# Patient Record
Sex: Male | Born: 1960 | Race: Black or African American | Hispanic: No | Marital: Married | Smoking: Never smoker
Health system: Southern US, Community
[De-identification: ages and names within clinical notes are randomized; demographics above are authoritative.]

## PROBLEM LIST (undated history)

## (undated) DIAGNOSIS — I10 Essential (primary) hypertension: Secondary | ICD-10-CM

## (undated) DIAGNOSIS — C801 Malignant (primary) neoplasm, unspecified: Secondary | ICD-10-CM

## (undated) DIAGNOSIS — E119 Type 2 diabetes mellitus without complications: Secondary | ICD-10-CM

## (undated) DIAGNOSIS — R972 Elevated prostate specific antigen [PSA]: Secondary | ICD-10-CM

## (undated) DIAGNOSIS — E7800 Pure hypercholesterolemia, unspecified: Secondary | ICD-10-CM

## (undated) HISTORY — PX: LEG SURGERY: SHX1003

## (undated) HISTORY — PX: PROSTATE BIOPSY: SHX241

---

## 1999-05-03 HISTORY — PX: LEG SURGERY: SHX1003

## 1999-05-19 ENCOUNTER — Encounter: Payer: Self-pay | Admitting: Emergency Medicine

## 1999-05-19 ENCOUNTER — Encounter: Payer: Self-pay | Admitting: Specialist

## 1999-05-19 ENCOUNTER — Inpatient Hospital Stay (HOSPITAL_COMMUNITY): Admission: AD | Admit: 1999-05-19 | Discharge: 1999-05-21 | Payer: Self-pay | Admitting: Specialist

## 2011-04-21 ENCOUNTER — Other Ambulatory Visit (HOSPITAL_COMMUNITY): Payer: Self-pay | Admitting: Internal Medicine

## 2011-04-21 ENCOUNTER — Other Ambulatory Visit (HOSPITAL_COMMUNITY): Payer: Self-pay | Admitting: Gastroenterology

## 2011-04-29 ENCOUNTER — Other Ambulatory Visit (HOSPITAL_COMMUNITY): Payer: Self-pay

## 2011-04-29 ENCOUNTER — Ambulatory Visit (HOSPITAL_COMMUNITY)
Admission: RE | Admit: 2011-04-29 | Discharge: 2011-04-29 | Disposition: A | Discharge status: Home / Self Care | Payer: 59 | Source: Ambulatory Visit | Attending: Gastroenterology | Admitting: Gastroenterology

## 2011-04-29 DIAGNOSIS — K5730 Diverticulosis of large intestine without perforation or abscess without bleeding: Secondary | ICD-10-CM | POA: Insufficient documentation

## 2013-10-16 ENCOUNTER — Encounter (HOSPITAL_COMMUNITY): Payer: Self-pay | Admitting: Emergency Medicine

## 2013-10-16 ENCOUNTER — Emergency Department (HOSPITAL_COMMUNITY)
Admission: EM | Admit: 2013-10-16 | Discharge: 2013-10-17 | Disposition: A | Discharge status: Home / Self Care | Payer: 59 | Attending: Emergency Medicine | Admitting: Emergency Medicine

## 2013-10-16 DIAGNOSIS — Y9389 Activity, other specified: Secondary | ICD-10-CM | POA: Insufficient documentation

## 2013-10-16 DIAGNOSIS — Z880 Allergy status to penicillin: Secondary | ICD-10-CM | POA: Insufficient documentation

## 2013-10-16 DIAGNOSIS — M549 Dorsalgia, unspecified: Secondary | ICD-10-CM

## 2013-10-16 DIAGNOSIS — S3981XA Other specified injuries of abdomen, initial encounter: Secondary | ICD-10-CM | POA: Insufficient documentation

## 2013-10-16 DIAGNOSIS — E119 Type 2 diabetes mellitus without complications: Secondary | ICD-10-CM | POA: Insufficient documentation

## 2013-10-16 DIAGNOSIS — IMO0002 Reserved for concepts with insufficient information to code with codable children: Secondary | ICD-10-CM | POA: Insufficient documentation

## 2013-10-16 DIAGNOSIS — Y92410 Unspecified street and highway as the place of occurrence of the external cause: Secondary | ICD-10-CM | POA: Insufficient documentation

## 2013-10-16 HISTORY — DX: Type 2 diabetes mellitus without complications: E11.9

## 2013-10-16 MED ORDER — TRAMADOL HCL 50 MG PO TABS: 50.0000 mg | ORAL_TABLET | Freq: Four times a day (QID) | ORAL | Status: DC | PRN
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
## 2013-10-16 NOTE — Discharge Instructions (Signed)
Take the prescribed medication as directed. May take additional tylenol if needed. You will continue to be sore for the next few days. Return to the ED for new or worsening symptoms.
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
## 2013-10-23 NOTE — ED Provider Notes (Signed)
Medical screening examination/treatment/procedure(s) were performed by non-physician practitioner and as supervising physician I was immediately available for consultation/collaboration.   EKG Interpretation None      Devoria AlbeIva Knapp, MD, Armando GangFACEP   Ward GivensIva L Knapp, MD 10/23/13 [phone]

## 2014-11-08 ENCOUNTER — Emergency Department (INDEPENDENT_AMBULATORY_CARE_PROVIDER_SITE_OTHER): Payer: Commercial Managed Care - HMO

## 2014-11-08 ENCOUNTER — Emergency Department (HOSPITAL_COMMUNITY)
Admission: EM | Admit: 2014-11-08 | Discharge: 2014-11-08 | Disposition: A | Discharge status: Home / Self Care | Payer: Commercial Managed Care - HMO | Source: Home / Self Care | Attending: Family Medicine | Admitting: Family Medicine

## 2014-11-08 ENCOUNTER — Encounter (HOSPITAL_COMMUNITY): Payer: Self-pay | Admitting: *Deleted

## 2014-11-08 DIAGNOSIS — M10032 Idiopathic gout, left wrist: Secondary | ICD-10-CM | POA: Diagnosis not present

## 2014-11-08 MED ORDER — NAPROXEN 500 MG PO TABS: 500.0000 mg | ORAL_TABLET | Freq: Two times a day (BID) | ORAL | Status: DC

## 2014-11-08 MED ORDER — COLCHICINE 0.6 MG PO TABS: 0.6000 mg | ORAL_TABLET | Freq: Two times a day (BID) | ORAL | Status: DC

## 2014-11-08 NOTE — ED Provider Notes (Signed)
CSN: [id number]     Arrival date & time 11/08/14  1403 History   First MD Initiated Contact with Patient 11/08/14 1447     Chief Complaint  Patient presents with  . Hand Problem   (Consider location/radiation/quality/duration/timing/severity/associated sxs/prior Treatment) Patient is a 54 y.o. male presenting with hand pain. The history is provided by the patient.  Hand Pain This is a new problem. The current episode started 6 to 12 hours ago. The problem has been gradually worsening. Associated symptoms comments: Awoke with pain and sts to left wrist this am, nki, no h/o gout , no other joints involved..    Past Medical History  Diagnosis Date  . Diabetes mellitus without complication    Past Surgical History  Procedure Laterality Date  . Leg surgery Left    History reviewed. No pertinent family history. History  Substance Use Topics  . Smoking status: Never Smoker   . Smokeless tobacco: Not on file  . Alcohol Use: No    Review of Systems  Constitutional: Negative.   Musculoskeletal: Positive for joint swelling.  Skin: Negative.     Allergies  Penicillins  Home Medications   Prior to Admission medications   Medication Sig Start Date End Date Taking? Authorizing Provider  colchicine 0.6 MG tablet Take 1 tablet (0.6 mg total) by mouth 2 (two) times daily. 11/08/14   Linna HoffJames D Etola Mull, MD  naproxen (NAPROSYN) 500 MG tablet Take 1 tablet (500 mg total) by mouth 2 (two) times daily. 11/08/14   Linna HoffJames D Tymeshia Awan, MD  traMADol (ULTRAM) 50 MG tablet Take 1 tablet (50 mg total) by mouth every 6 (six) hours as needed. 10/16/13   Garlon HatchetLisa M Sanders, PA-C   BP 137/82 mmHg  Pulse 65  Temp(Src) 98.5 F (36.9 C) (Oral)  Resp 16  SpO2 96% Physical Exam  Constitutional: He is oriented to person, place, and time. He appears well-developed and well-nourished. No distress.  Musculoskeletal: He exhibits tenderness.       Left wrist: He exhibits decreased range of motion, tenderness, bony tenderness  and swelling. He exhibits no deformity.  Neurological: He is alert and oriented to person, place, and time.  Skin: Skin is warm and dry. No rash noted.  Nursing note and vitals reviewed.   ED Course  Procedures (including critical care time) Labs Review Labs Reviewed - No data to display  Imaging Review Dg Wrist Complete Left  11/08/2014   CLINICAL DATA:  Wrist pain for 1 day  EXAM: LEFT WRIST - COMPLETE 3+ VIEW  COMPARISON:  None.  FINDINGS: There is no evidence of fracture or dislocation. There is no evidence of arthropathy or other focal bone abnormality. Soft tissues are unremarkable.  IMPRESSION: Negative.   Electronically Signed   By: Jolaine ClickArthur  Hoss M.D.   On: 11/08/2014 15:23   X-rays reviewed and report per radiologist.   MDM   1. Acute idiopathic gout of left wrist        Linna HoffJames D Cynara Tatham, MD 11/08/14 1535

## 2014-11-08 NOTE — Discharge Instructions (Signed)
Wear splint for comfort, take both medicines as prescribed, see hand specialist next week for recheck.

## 2014-11-08 NOTE — ED Notes (Signed)
Pt  Reports  Symptoms  Of     Swelling  Of the l  Hand   -    He  denys     Any  Injury        The  Hand  For the  Most part is  Not  painfull

## 2016-03-03 IMAGING — DX DG WRIST COMPLETE 3+V*L*
4 series · 4 of 4 positions shown · non-contrast
Comparison: None.

CLINICAL DATA: Wrist pain for 1 day

EXAM:
LEFT WRIST - COMPLETE 3+ VIEW

[wrist pa]
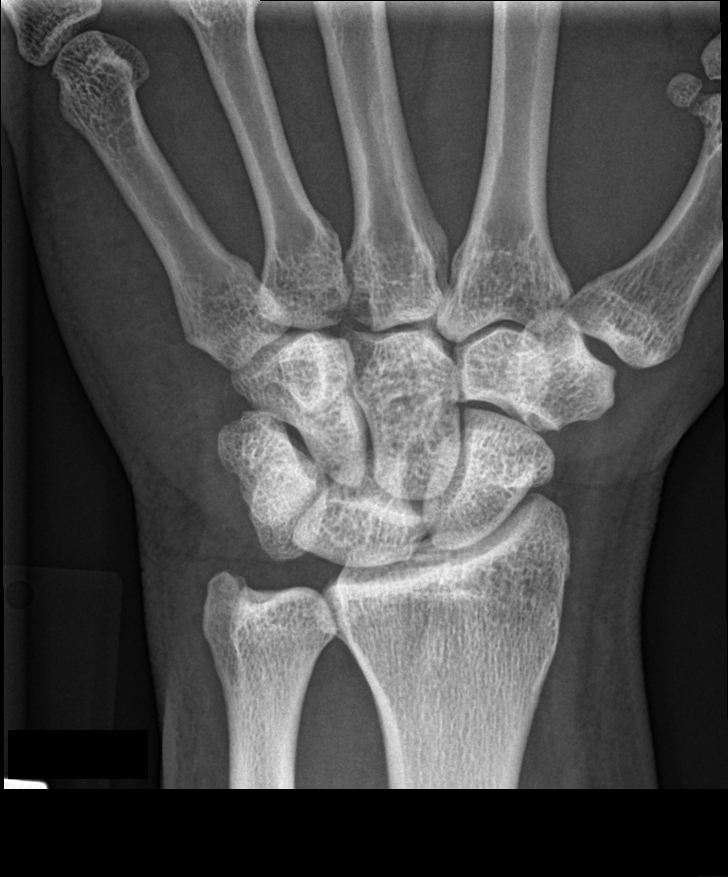

[wrist navicular]
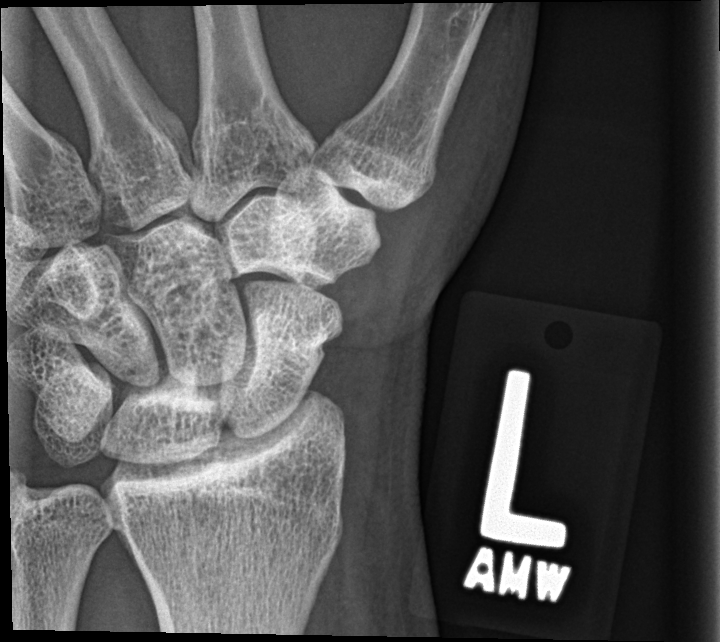

[wrist obl]
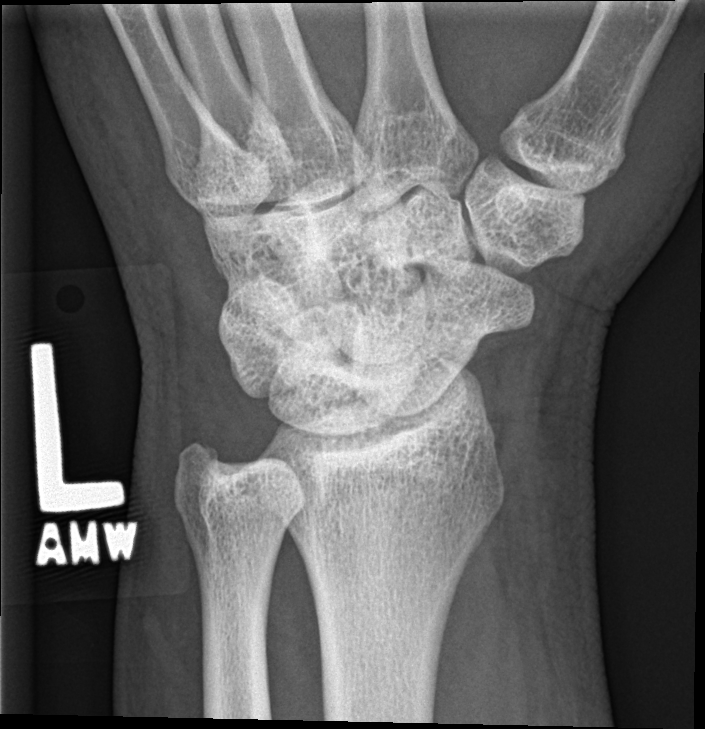

[wrist lat]
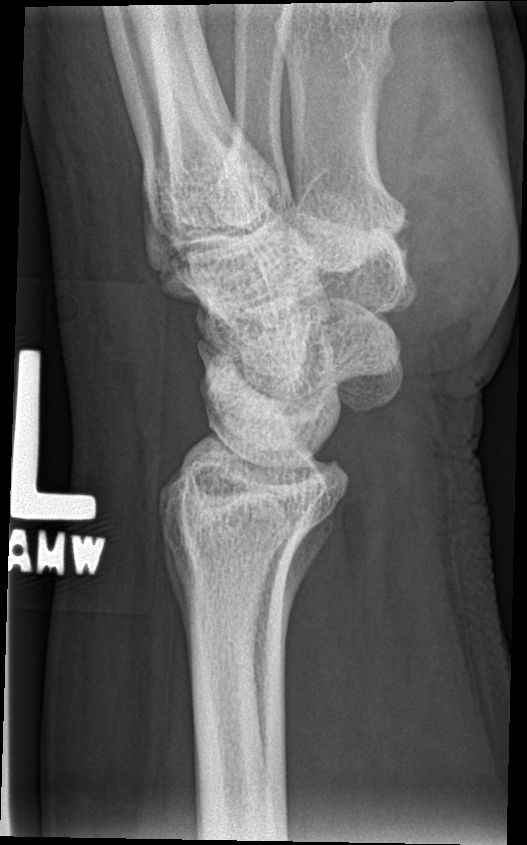

[4 of 4 positions shown; findings below may reference images not displayed]

FINDINGS: There is no evidence of fracture or dislocation. There is no
evidence of arthropathy or other focal bone abnormality. Soft
tissues are unremarkable.
IMPRESSION: Negative.

## 2016-10-04 DIAGNOSIS — I10 Essential (primary) hypertension: Secondary | ICD-10-CM | POA: Diagnosis not present

## 2016-10-04 DIAGNOSIS — E782 Mixed hyperlipidemia: Secondary | ICD-10-CM | POA: Diagnosis not present

## 2016-10-04 DIAGNOSIS — E119 Type 2 diabetes mellitus without complications: Secondary | ICD-10-CM | POA: Diagnosis not present

## 2017-04-13 DIAGNOSIS — Z0000 Encounter for general adult medical examination without abnormal findings: Secondary | ICD-10-CM | POA: Diagnosis not present

## 2017-04-13 DIAGNOSIS — E291 Testicular hypofunction: Secondary | ICD-10-CM | POA: Diagnosis not present

## 2017-04-13 DIAGNOSIS — E782 Mixed hyperlipidemia: Secondary | ICD-10-CM | POA: Diagnosis not present

## 2017-04-13 DIAGNOSIS — I10 Essential (primary) hypertension: Secondary | ICD-10-CM | POA: Diagnosis not present

## 2017-04-13 DIAGNOSIS — E119 Type 2 diabetes mellitus without complications: Secondary | ICD-10-CM | POA: Diagnosis not present

## 2017-04-20 DIAGNOSIS — E291 Testicular hypofunction: Secondary | ICD-10-CM | POA: Diagnosis not present

## 2017-10-12 DIAGNOSIS — I10 Essential (primary) hypertension: Secondary | ICD-10-CM | POA: Diagnosis not present

## 2017-10-12 DIAGNOSIS — E7800 Pure hypercholesterolemia, unspecified: Secondary | ICD-10-CM | POA: Diagnosis not present

## 2017-10-12 DIAGNOSIS — E291 Testicular hypofunction: Secondary | ICD-10-CM | POA: Diagnosis not present

## 2017-10-12 DIAGNOSIS — E1169 Type 2 diabetes mellitus with other specified complication: Secondary | ICD-10-CM | POA: Diagnosis not present

## 2017-12-26 DIAGNOSIS — H40003 Preglaucoma, unspecified, bilateral: Secondary | ICD-10-CM | POA: Diagnosis not present

## 2018-05-16 DIAGNOSIS — Z0000 Encounter for general adult medical examination without abnormal findings: Secondary | ICD-10-CM | POA: Diagnosis not present

## 2018-05-16 DIAGNOSIS — E7800 Pure hypercholesterolemia, unspecified: Secondary | ICD-10-CM | POA: Diagnosis not present

## 2018-05-16 DIAGNOSIS — E1169 Type 2 diabetes mellitus with other specified complication: Secondary | ICD-10-CM | POA: Diagnosis not present

## 2019-12-17 ENCOUNTER — Other Ambulatory Visit: Payer: Commercial Managed Care - HMO

## 2021-04-01 HISTORY — PX: COLONOSCOPY: SHX174

## 2023-05-24 ENCOUNTER — Other Ambulatory Visit: Payer: Self-pay | Admitting: Nurse Practitioner

## 2023-05-24 DIAGNOSIS — R972 Elevated prostate specific antigen [PSA]: Secondary | ICD-10-CM

## 2023-06-25 ENCOUNTER — Ambulatory Visit
Admission: RE | Admit: 2023-06-25 | Discharge: 2023-06-25 | Disposition: A | Discharge status: Home / Self Care | Payer: Medicare Other | Source: Ambulatory Visit | Attending: Nurse Practitioner | Admitting: Nurse Practitioner

## 2023-06-25 DIAGNOSIS — R972 Elevated prostate specific antigen [PSA]: Secondary | ICD-10-CM

## 2023-06-25 MED ORDER — GADOPICLENOL 0.5 MMOL/ML IV SOLN
10.0000 mL | Freq: Once | INTRAVENOUS | Status: AC | PRN
  Administered 2023-06-25: 10 mL via INTRAVENOUS

## 2023-07-24 DIAGNOSIS — E1169 Type 2 diabetes mellitus with other specified complication: Secondary | ICD-10-CM | POA: Diagnosis not present

## 2023-07-24 DIAGNOSIS — Z0000 Encounter for general adult medical examination without abnormal findings: Secondary | ICD-10-CM | POA: Diagnosis not present

## 2023-07-24 DIAGNOSIS — I10 Essential (primary) hypertension: Secondary | ICD-10-CM | POA: Diagnosis not present

## 2023-07-24 DIAGNOSIS — Z23 Encounter for immunization: Secondary | ICD-10-CM | POA: Diagnosis not present

## 2023-07-24 DIAGNOSIS — E7800 Pure hypercholesterolemia, unspecified: Secondary | ICD-10-CM | POA: Diagnosis not present

## 2023-07-24 DIAGNOSIS — Z5181 Encounter for therapeutic drug level monitoring: Secondary | ICD-10-CM | POA: Diagnosis not present

## 2024-01-12 ENCOUNTER — Telehealth: Payer: Self-pay

## 2024-01-12 NOTE — Telephone Encounter (Signed)
 I called pt to introduce myself as  the Coordinator of the [facility].   1. I confirmed with the patient he is aware of his referral to the clinic 9/23, arriving @ 12:30 pm.    2. I discussed the format of the clinic and the physicians he will be seeing that day.   3. I discussed where the clinic is located and how to contact me.   4. I confirmed his address and informed him I would be mailing a packet of information and forms to be completed. I asked him to bring them with him the day of his appointment.    He voiced understanding of the above. I asked him to call me if he has any questions or concerns regarding his appointments or the forms he needs to complete.

## 2024-01-18 NOTE — Progress Notes (Signed)
                               Care Plan Summary  Name: Mekai Wilkinson DOB: 08-23-1960   Your Medical Team:   Urologist -  Dr. Gretel Ferrara, [facility]  Radiation Oncologist - Dr. Donnice Barge, [facility]   Medical Oncologist - Dr. Pauletta Chihuahua, [facility]  Recommendations: Surgery Radiation  * These recommendations are based on information available as of today's consult.      Recommendations may change depending on the results of further tests or exams.    Next Steps: 1) Consider all your options.  Contact Vertell, your nurse navigator, with any questions or treatment decision.  When appointments need to be scheduled, you will be contacted by [facility] and/or [facility].  Questions?  Please do not hesitate to call Vertell Pont, BSN, RN at [phone] with any questions or concerns.  Vertell is your Oncology Nurse Navigator and is available to assist you while you're receiving your medical care at [facility].

## 2024-01-19 ENCOUNTER — Telehealth: Payer: Self-pay | Admitting: Radiation Oncology

## 2024-01-19 NOTE — Telephone Encounter (Signed)
 9/19 patient left voicemail concerning his E check in through mychart for his appt on 9/23.  Forward email to Vertell and copied Sari, so they are aware

## 2024-01-22 DIAGNOSIS — C61 Malignant neoplasm of prostate: Secondary | ICD-10-CM | POA: Insufficient documentation

## 2024-01-22 NOTE — Progress Notes (Unsigned)
 [facility] CONSULT NOTE  Patient Care Team: Rexanne Ingle, MD as PCP - General (Internal Medicine) Vertell Pont, RN as Oncology Nurse Navigator  ASSESSMENT & PLAN:  Chris Espinoza is a 63 y.o.male with history of DM2, hypertension, hyperlipidemia being seen at [facility] for prostate cancer.  Initial diagnosis: [id number] GS 4+3=7 GG3 in 1 core. 3+3 in another core. PSA 5.17 localized unfavorable intermediate risk PC Treatment: To be determined  His case was discussed at tumor board with multiple specialists including radiation oncologist, urology oncologist, pathologist, radiologist. The patient was counseled on the natural history of prostate cancer and the standard treatment options that are available for prostate cancer.   For unfavorable intermediate risk, per NCCN guideline, in patients with > 10 years of life expectancy, RP + PLND or RT + ADT are both options resulted in excellent long term survival.  Treatment is recommended. Patient was encouraged to consider the side effect profiles of each treatment modality and make an informed decision for definitive treatment.  Patient will follow-up with radiation oncology or urologic oncology for definitive treatment.  He may follow-up with medical oncology as needed.  Supportive calcium (1000-1200 mg daily from food and supplements) and vitamin D3 (1000 IU daily) Healthy lifestyle to prevent diabetes and CV disease Weight-bearing exercises (30 minutes per day) Limit alcohol consumption and avoid smoking  All questions were answered. The patient knows to call the clinic with any problems, questions or concerns. No barriers to learning was detected.  Chris JAYSON Chihuahua, MD 9/23/20255:02 PM  CHIEF COMPLAINTS/PURPOSE OF CONSULTATION:  prostate cancer  HISTORY OF PRESENTING ILLNESS:  Chris Espinoza 63 y.o. male is here because of prostate cancer.  I have reviewed his chart and materials related to his cancer extensively and  collaborated history with the patient. Summary of oncologic history is as follows:  Chris Espinoza was referred to urology for elevated PSA.  Report PSA was 3.52 in February 2022, 3.36 in February 2023, 6.43 in March 2024.  Follow-up in July 2024 showed PSA dropped to 3.77.  In 05/2023 PSA rise to 4.6.  He has no worsening LUTS.  Follow-up in 08/2023 PSA rise to 5.17.  MRI from 06/2023 showed no focal prostatic lesion. PI-RADS category 2. He has BPH.  He had a follow-up biopsy on 12/06/2023.  Pathology showed rostate adenocarcinoma GS 4+3=7 in 1 core. 3+3 in another core.   He denies any significant urinary symptoms such as dysuria, increased frequency, hesitancy, hematuria or difficulty urinating.  Oncology History  Primary prostate adenocarcinoma (HCC)  12/06/2023 Pathology Results   Prostate biopsy Prostate adenocarcinoma GS 4+3=7 in 1 core.  3+3 in another core.   01/22/2024 Initial Diagnosis   Primary prostate adenocarcinoma [facility])     MEDICAL HISTORY:  Past Medical History:  Diagnosis Date   Diabetes mellitus without complication (HCC)    Elevated PSA    Hypercholesterolemia    Hypertension     SURGICAL HISTORY: Past Surgical History:  Procedure Laterality Date   COLONOSCOPY  04/2021   LEG SURGERY Left 05/1999   PROSTATE BIOPSY      SOCIAL HISTORY: Social History   Socioeconomic History   Marital status: Married    Spouse name: Not on file   Number of children: Not on file   Years of education: Not on file   Highest education level: Not on file  Occupational History   Not on file  Tobacco Use   Smoking status: Never   Smokeless tobacco: Never  Vaping Use  Vaping status: Never Used  Substance and Sexual Activity   Alcohol use: No   Drug use: No   Sexual activity: Not on file  Other Topics Concern   Not on file  Social History Narrative   Not on file   Social Drivers of Health   Financial Resource Strain: Not on file  Food Insecurity: No Food Insecurity  (01/23/2024)   Hunger Vital Sign    Worried About Running Out of Food in the Last Year: Never true    Ran Out of Food in the Last Year: Never true  Transportation Needs: No Transportation Needs (01/23/2024)   PRAPARE - Administrator, Civil Service (Medical): No    Lack of Transportation (Non-Medical): No  Physical Activity: Not on file  Stress: Not on file  Social Connections: Not on file  Intimate Partner Violence: Not At Risk (01/23/2024)   Humiliation, Afraid, Rape, and Kick questionnaire    Fear of Current or Ex-Partner: No    Emotionally Abused: No    Physically Abused: No    Sexually Abused: No    FAMILY HISTORY: Family History  Problem Relation Age of Onset   Prostate cancer Brother     ALLERGIES:  is allergic to penicillins.  MEDICATIONS:  Current Outpatient Medications  Medication Sig Dispense Refill   atorvastatin (LIPITOR) 10 MG tablet Take 10 mg by mouth daily.     Lancets (ONETOUCH DELICA PLUS LANCET33G) MISC USE TO CHECK YOUR BLOOD SUGAR  ONCE DAILY AS DIRECETED; Duration: 100     metFORMIN (GLUCOPHAGE) 500 MG tablet Take 500 mg by mouth 2 (two) times daily.     ONETOUCH VERIO test strip 1 each daily.     OZEMPIC, 2 MG/DOSE, 8 MG/3ML SOPN Inject 2 mg into the skin once a week.     ramipril (ALTACE) 2.5 MG capsule Take 2.5 mg by mouth daily.     No current facility-administered medications for this visit.    REVIEW OF SYSTEMS:   All relevant systems were reviewed with the patient and are negative.  PHYSICAL EXAMINATION: ECOG PERFORMANCE STATUS: 0 - Asymptomatic  GENERAL: alert, no distress and comfortable   LABORATORY & PATHOLOGY DATA:  I have reviewed the results of labs, PSA and biopsy results related to his cancer.  RADIOGRAPHIC STUDIES: I have reviewed the radiological images related to his cancer.

## 2024-01-23 ENCOUNTER — Encounter: Payer: Self-pay | Admitting: Radiation Oncology

## 2024-01-23 ENCOUNTER — Inpatient Hospital Stay

## 2024-01-23 ENCOUNTER — Ambulatory Visit
Admission: RE | Admit: 2024-01-23 | Discharge: 2024-01-23 | Disposition: A | Discharge status: Home / Self Care | Source: Ambulatory Visit | Attending: Radiation Oncology | Admitting: Radiation Oncology

## 2024-01-23 VITALS — BP 146/78 | HR 58 | Temp 97.1°F | Resp 18 | Ht 68.0 in | Wt 204.2 lb

## 2024-01-23 DIAGNOSIS — C61 Malignant neoplasm of prostate: Secondary | ICD-10-CM | POA: Insufficient documentation

## 2024-01-23 DIAGNOSIS — Z8042 Family history of malignant neoplasm of prostate: Secondary | ICD-10-CM | POA: Insufficient documentation

## 2024-01-23 DIAGNOSIS — Z191 Hormone sensitive malignancy status: Secondary | ICD-10-CM | POA: Diagnosis not present

## 2024-01-23 HISTORY — DX: Essential (primary) hypertension: I10

## 2024-01-23 HISTORY — DX: Elevated prostate specific antigen (PSA): R97.20

## 2024-01-23 HISTORY — DX: Pure hypercholesterolemia, unspecified: E78.00

## 2024-01-23 NOTE — Progress Notes (Incomplete)
 SABRA

## 2024-01-23 NOTE — Progress Notes (Signed)
 Radiation Oncology         [phone] ________________________________  Multidisciplinary Prostate Cancer Clinic  Initial Radiation Oncology Consultation  Name: Chris Espinoza MRN: [id number]  Date: 01/23/2024  DOB: June 06, 1960  RR:Enopuz, Tanda, MD  Watt Rush, MD   REFERRING PHYSICIAN: Watt Rush, MD  DIAGNOSIS: 63 y.o. gentleman with stage T1c adenocarcinoma of the prostate with a Gleason's score of 4+3 and a PSA of 5.17    ICD-10-CM   1. Malignant neoplasm of prostate (HCC)  C61       HISTORY OF PRESENT ILLNESS::Chris Espinoza is a 63 y.o. gentleman. He was initially referred for evaluation in urology by Dr. Watt on 07/25/22 for an elevated PSA of 6.43. A repeat PSA in 08/2022, after a week of abstinence, had decreased to 4.22 and decreased further, to 3.77 in 10/2022 so they elected to continue to monitor closely. His PSA increased slightly to 4.6 in 05/2023. This prompted further evaluation with a a prostate MRI which was performed on 06/25/23 and was negative for any high grade, suspicious lesions. His PSA continued to increase, reaching 5.17 in 08/2023. Digital rectal examinations have shown stable bilateral lobe firmness without discrete nodularity. The patient proceeded to transrectal ultrasound with 12 biopsies of the prostate on 12/06/23.  The prostate volume measured 51 cc.  Out of 12 core biopsies,2 were positive.  The maximum Gleason score was 4+3, and this was seen in the left base lateral. Additionally, Gleason 3+3 was seen in the left apex.    The patient reviewed the biopsy results with his urologist and he has kindly been referred today to the multidisciplinary prostate cancer clinic for presentation of pathology and radiology studies in our conference for discussion of potential radiation treatment options and clinical evaluation.   PREVIOUS RADIATION THERAPY: No  PAST MEDICAL HISTORY:  has a past medical history of Diabetes mellitus without complication (HCC).     PAST SURGICAL HISTORY: Past Surgical History:  Procedure Laterality Date   LEG SURGERY Left     FAMILY HISTORY: family history is not on file.  SOCIAL HISTORY:  reports that he has never smoked. He does not have any smokeless tobacco history on file. He reports that he does not drink alcohol and does not use drugs.  ALLERGIES: Penicillins  MEDICATIONS:  Current Outpatient Medications  Medication Sig Dispense Refill   colchicine  0.6 MG tablet Take 1 tablet (0.6 mg total) by mouth 2 (two) times daily. 30 tablet 1   naproxen  (NAPROSYN ) 500 MG tablet Take 1 tablet (500 mg total) by mouth 2 (two) times daily. 30 tablet 1   traMADol  (ULTRAM ) 50 MG tablet Take 1 tablet (50 mg total) by mouth every 6 (six) hours as needed. 20 tablet 0   No current facility-administered medications for this encounter.    REVIEW OF SYSTEMS:  On review of systems, the patient reports that he is doing well overall. He denies any chest pain, shortness of breath, cough, fevers, chills, night sweats, unintended weight changes. He denies any bowel disturbances, and denies abdominal pain, nausea or vomiting. He denies any new musculoskeletal or joint aches or pains. His IPSS was 2, indicating minimal urinary symptoms. His SHIM was 10, indicating he has moderate-severe erectile dysfunction. A complete review of systems is obtained and is otherwise negative.   PHYSICAL EXAM:  Wt Readings from Last 3 Encounters:  01/23/24 204 lb 3.2 oz (92.6 kg)  10/16/13 235 lb (106.6 kg)   Temp Readings from Last 3 Encounters:  01/23/24 ROLLEN)  97.1 F (36.2 C)  11/08/14 98.5 F (36.9 C) (Oral)  10/16/13 98.4 F (36.9 C) (Oral)   BP Readings from Last 3 Encounters:  01/23/24 (!) 146/78  11/08/14 137/82  10/16/13 134/67   Pulse Readings from Last 3 Encounters:  01/23/24 (!) 58  11/08/14 65  10/16/13 60   Pain Assessment Pain Score: 0-No pain/10  In general this is a well appearing African American man in no acute  distress. He's alert and oriented x4 and appropriate throughout the examination. Cardiopulmonary assessment is negative for acute distress and he exhibits normal effort.    KPS = 100  100 - Normal; no complaints; no evidence of disease. 90   - Able to carry on normal activity; minor signs or symptoms of disease. 80   - Normal activity with effort; some signs or symptoms of disease. 61   - Cares for self; unable to carry on normal activity or to do active work. 60   - Requires occasional assistance, but is able to care for most of his personal needs. 50   - Requires considerable assistance and frequent medical care. 40   - Disabled; requires special care and assistance. 30   - Severely disabled; hospital admission is indicated although death not imminent. 20   - Very sick; hospital admission necessary; active supportive treatment necessary. 10   - Moribund; fatal processes progressing rapidly. 0     - Dead  Karnofsky DA, Abelmann WH, Craver LS and Burchenal JH [phone]) The use of the nitrogen mustards in the palliative treatment of carcinoma: with particular reference to bronchogenic carcinoma Cancer 1 634-56   LABORATORY DATA:  No results found for: WBC, HGB, HCT, MCV, PLT No results found for: NA, K, CL, CO2 No results found for: ALT, AST, GGT, ALKPHOS, BILITOT   RADIOGRAPHY: No results found.    IMPRESSION/PLAN: 63 y.o. gentleman with Stage T1c adenocarcinoma of the prostate with a Gleason score of 4+3 and a PSA of 5.17.    We discussed the patient's workup and outlined the nature of prostate cancer in this setting. The patient's T stage, Gleason's score, and PSA put him into the unfavorable intermediate risk group. Accordingly, he is eligible for a variety of potential treatment options including brachytherapy, 5.5 weeks of external radiation, or prostatectomy. We discussed the available radiation techniques, and focused on the details and logistics of  delivery. We discussed and outlined the risks, benefits, short and long-term effects associated with radiotherapy and compared and contrasted these with prostatectomy. We discussed the role of SpaceOAR gel in reducing the rectal toxicity associated with radiotherapy. He appears to have a good understanding of his disease and our treatment recommendations which are of curative intent.  He was encouraged to ask questions that were answered to his stated satisfaction.  At the end of the conversation the patient is undecided regarding his treatment preference and would like to take some additional time to consider his options.  He has our contact information and will let us  know if he ultimately elects to proceed with one of the radiation options and we will move forward with treatment planning accordingly at that time.  We enjoyed meeting him and his wife today and look forward to continuing to participate in his care.   We personally spent 60 minutes in this encounter including chart review, reviewing radiological studies, meeting face-to-face with the patient, entering orders and completing documentation.    Sabra MICAEL Rusk, PA-C    Donnice Barge, MD  [facility]  Radiation Oncology Direct Dial: [phone]  Fax: [phone] [email or web address]  Skype  LinkedIn   This document serves as a record of services personally performed by Donnice Barge, MD and Sabra Rusk, PA-C. It was created on their behalf by Izetta Neither, a trained medical scribe. The creation of this record is based on the scribe's personal observations and the provider's statements to them. This document has been checked and approved by the attending provider.

## 2024-01-23 NOTE — Consult Note (Signed)
 Multi-Disciplinary Clinic     01/23/2024   --------------------------------------------------------------------------------   Chris Espinoza  MRN: [id number]  DOB: 09-03-1960, 63 year old Male  SSN:    PRIMARY CARE:  Tanda Bame  PRIMARY CARE FAX:  [phone]  REFERRING:  Norleen Seltzer, MD  PROVIDER:  Norleen Seltzer, M.D.  TREATING:  Gretel Ferrara, M.D.  LOCATION:  [facility] [phone]     --------------------------------------------------------------------------------   CC/HPI: CC: Prostate Cancer   Physician requesting consult: Dr. Norleen Seltzer  PCP: Dr. Tanda Bame  Location of consult: [facility] - Prostate Cancer Multidisciplinary Clinic   Chris Espinoza is a 63 year old gentleman who is seen for an elevated PSA of 4.6 that prompted an MRI of the prostate on 06/25/2023. This was unremarkable. However, his PSA further increased to 5.17 prompting a TRUS biopsy of the prostate on 12/06/2023 by Dr. 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   Family history: Brother   Imaging studies: MRI (06/25/2023) - No EPE, SVI, LAD, or bone lesions.   PMH: He has a history of diabetes, hyperlipidemia, and hypertension.  PSH: No abdominal surgeries.   TNM stage: cT1c N0 Mx  PSA: 5.17  Gleason score: 4+3 = 7 (grade group 3)  Biopsy (12/06/2023): 2/12 cores positive  Left: Left apex (60%, 3+3 = 6), left lateral base (20%, 4+3 = 7)  Right: Benign  Prostate volume: 51.0 cc   Nomogram  OC disease: 58%  EPE: 39%  SVI: 5%  LNI: 7%  PFS (5 year, 10 year): 70%, 55%   Urinary function: IPSS is 2.  Erectile function: SHIM score is 10.     ALLERGIES: Penicillin    MEDICATIONS: Atorvastatin Calcium 10 MG Tablet  levoFLOXacin 750 MG Tablet 1 tablet po 1 hour prior to procedure.  metFORMIN HCl ER (MOD) 500 MG Tablet Extended Release 24 Hour  Ozempic  Ramipril 2.5 MG Capsule     GU PSH:  Prostate Needle Biopsy - 12/06/2023       PSH Notes: Broken leg   NON-GU PSH: Surgical Pathology, Gross And Microscopic Examination For Prostate Needle - 12/06/2023 Visit Complexity (formerly GPC1X) - 12/15/2023, 09/18/2023, 10/31/2022     GU PMH: ED due to arterial insufficiency - 12/15/2023 Elevated PSA - 12/15/2023, - 12/06/2023, His PSA is back up but below the prior high. His exam is benign and the MRI was negative. I discussed proceeding with a biopsy or getting an ExoDx test. I will send the ExoDx test and if that is below 15.6 I will have him return in 4mo with a PSA. If the ExoDx is >15.6, I will set up a biopsy. , - 09/18/2023, - 05/15/2023, His PSA has returned to normal. I will have him return in 6 months for a repeat and if it remains normal, he can resume PSA f/u with Dr. Polite. , - 10/31/2022, His PSA is up to 6.43 from 3.36 last year. He has no prostate nodularity or concerning symptoms. I will repeat the PSA with a week of abstinence and if still up, I will get an MRIP and consider a biopsy. If down, I will get a repeat in 3 months. 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 I reviewed AS, RALP, Seeds, EXRT, Cryo and HIFU with him. I feel he needs to be considered for  active therapy and he seems most interested in a seed implant but I will get him set up with the [facility] to make sure he has fully examined his options. - 12/15/2023 BPH w/o LUTS - 09/18/2023, Bilateral lobes are somewhat firm on palpation but no other palpable abnormality appreciated. He has some occasional morning time hesitancy but otherwise denies any bothersome lower urinary tract symptoms., - 05/15/2023, His prostate is mildly large and uniformly firm without nodules. , - 07/25/2022 Encounter for Prostate Cancer screening - 05/15/2023    NON-GU PMH: Diabetes Type 2    FAMILY HISTORY: Death In The Family Father - Other Prostate  Cancer - Brother   SOCIAL HISTORY: Marital Status: Married Has never drank.  Patient's occupation is/was Retired from the city[place]    REVIEW OF SYSTEMS:    GU Review Male:   Patient denies frequent urination, hard to postpone urination, burning/ pain with urination, get up at night to urinate, leakage of urine, stream starts and stops, trouble starting your streams, and have to strain to urinate .  Gastrointestinal (Lower):   Patient denies diarrhea and constipation.  Gastrointestinal (Upper):   Patient denies nausea and vomiting.  Constitutional:   Patient denies fever, night sweats, weight loss, and fatigue.  Skin:   Patient denies skin rash/ lesion and itching.  Eyes:   Patient denies blurred vision and double vision.  Ears/ Nose/ Throat:   Patient denies sore throat and sinus problems.  Hematologic/Lymphatic:   Patient denies swollen glands and easy bruising.  Cardiovascular:   Patient denies leg swelling and chest pains.  Respiratory:   Patient denies cough and shortness of breath.  Endocrine:   Patient denies excessive thirst.  Musculoskeletal:   Patient denies joint pain and back pain.  Neurological:   Patient denies headaches and dizziness.  Psychologic:   Patient denies depression and anxiety.   VITAL SIGNS: None   GU PHYSICAL EXAMINATION:    Prostate: Prostate about 50 grams. Left lobe normal consistency, right lobe normal consistency. Symmetrical lobes. No prostate nodule. Left lobe no tenderness, right lobe no tenderness.    MULTI-SYSTEM PHYSICAL EXAMINATION:    Constitutional: Well-nourished. No physical deformities. Normally developed. Good grooming.  Respiratory: No labored breathing, no use of accessory muscles. Clear bilaterally.  Cardiovascular: Normal temperature, normal extremity pulses, no swelling, no varicosities. Regular rate and rhythm.  Gastrointestinal: Obese abdomen.     Complexity of Data:  Lab Test Review:   PSA  Records Review:   Pathology Reports,  Previous Patient Records  X-Ray Review: MRI Prostate GSORAD: Reviewed Films.     09/11/23 05/08/23 10/24/22 08/02/22 06/24/22 06/26/21 06/26/20  PSA  Total PSA 5.17 ng/mL 4.60 ng/mL 3.77 ng/mL 4.22 ng/mL 6.43 ng/ml 3.36 ng/ml 3.5 ng/ml  Free PSA 0.89 ng/mL 0.91 ng/mL  0.64 ng/mL     % Free PSA 17 % PSA 20 % PSA  15 % PSA       PROCEDURES: None   ASSESSMENT:      ICD-10 Details  1 GU:   Prostate Cancer - C61    PLAN:           Document Letter(s):  Created for Patient: Clinical Summary         Notes:   1. Unfavorable intermediate risk prostate cancer: I had a detailed discussion with Chris Espinoza and his wife regarding his prostate cancer diagnosis. He has met with Dr. Patrcia earlier this afternoon. The patient was counseled about the natural history of prostate cancer and  the standard treatment options that are available for prostate cancer. It was explained to him how his age and life expectancy, clinical stage, Gleason score/prognostic grade group, and PSA (and PSA density) affect his prognosis, the decision to proceed with additional staging studies, as well as how that information influences recommended treatment strategies. We discussed the roles for active surveillance, radiation therapy, surgical therapy, androgen deprivation, as well as ablative therapy and other investigational options for the treatment of prostate cancer as appropriate to his individual cancer situation. We discussed the risks and benefits of these options with regard to their impact on cancer control and also in terms of potential adverse events, complications, and impact on quality of life particularly related to urinary and sexual function. The patient was encouraged to ask questions throughout the discussion today and all questions were answered to his stated satisfaction. In addition, the patient was provided with and/or directed to appropriate resources and literature for further education about prostate cancer  and treatment options. We discussed surgical therapy for prostate cancer including the different available surgical approaches. We discussed, in detail, the risks and expectations of surgery with regard to cancer control, urinary control, and erectile function as well as the expected postoperative recovery process. Additional risks of surgery including but not limited to bleeding, infection, hernia formation, nerve damage, lymphocele formation, bowel/rectal injury potentially necessitating colostomy, damage to the urinary tract resulting in urine leakage, urethral stricture, and the cardiopulmonary risks such as myocardial infarction, stroke, death, venothromboembolism, etc. were explained. The risk of open surgical conversion for robotic/laparoscopic prostatectomy was also discussed.   At this time, he remains undecided about his choice for treatment. If he did elect to proceed with surgical therapy, my tentative plan would be to perform a bilateral nerve-sparing robot-assisted laparoscopic radical prostatectomy and bilateral pelvic lymphadenectomy.   CC: Dr. Norleen Seltzer  Dr. Tanda Bame  Dr. Donnice Barge  Dr. Pauletta Chihuahua        Next Appointment:      Next Appointment: 03/13/2024 10:30 AM    Appointment Type: Laboratory Appointment    Location: [facility] [phone]    Provider: Lab LAB    Reason for Visit: 6 mo PSA with Reflex      E & M CODES: We spent 48 minutes dedicated to evaluation and management time, including face to face interaction, discussions on coordination of care, documentation, result review, and discussion with others as applicable.

## 2024-01-24 DIAGNOSIS — E1169 Type 2 diabetes mellitus with other specified complication: Secondary | ICD-10-CM | POA: Diagnosis not present

## 2024-01-24 DIAGNOSIS — I10 Essential (primary) hypertension: Secondary | ICD-10-CM | POA: Diagnosis not present

## 2024-01-24 DIAGNOSIS — E7800 Pure hypercholesterolemia, unspecified: Secondary | ICD-10-CM | POA: Diagnosis not present

## 2024-01-30 NOTE — Progress Notes (Signed)
 RN left message for call back after recent visit from [facility] on 01/23/24.

## 2024-02-01 NOTE — Progress Notes (Signed)
 RN spoke with patient to follow up from recent [facility].  Patient remains undecided at this time and would like to take additional time to consider his options.  Patient is leaning towards surgery and will review educational videos in the next few days.

## 2024-02-02 NOTE — Addendum Note (Signed)
 Encounter addended by: Patrcia Cough, MD on: 02/02/2024 12:36 PM  Actions taken: Problem List reviewed, Medication List reviewed, Allergies reviewed

## 2024-02-13 NOTE — Progress Notes (Signed)
 RN left message for call back to follow up to review treatment decision or any additional questions.

## 2024-02-22 NOTE — Progress Notes (Signed)
 RN left message for call back to review treatment decision or any follow up questions.

## 2024-02-29 NOTE — Progress Notes (Signed)
 RN spoke with patient and he has confirmed he would like to proceed with robotic prostatectomy for his stage T1c adenocarcinoma of the prostate with a Gleason's score of 4+3 and a PSA of 5.17.   MD notified.  Plan of care in progress.

## 2024-04-05 NOTE — Progress Notes (Signed)
 RN spoke with patient, surgery date still pending at this time.  RN left message with Zona at [facility] to follow up.  Will continue to follow.

## 2024-04-09 NOTE — Progress Notes (Signed)
 Surgery date pending per Zona due to waiting for 2026 schedule to open.    RN updated patient, no additional needs at this time.

## 2024-04-10 ENCOUNTER — Other Ambulatory Visit: Payer: Self-pay | Admitting: Urology

## 2024-04-15 NOTE — Progress Notes (Signed)
 Patient is now scheduled for surgery on 05/16/2024.

## 2024-05-03 NOTE — Progress Notes (Addendum)
 Anesthesia Review:  PCP: Tanda Bame  Cardiologist : none   PPM/ ICD: Device Orders: Rep Notified:  Chest x-ray : EKG :05/07/24  Echo : Stress test: Cardiac Cath :   Activity level: can do a flight of stairs without difficulty  Sleep Study/ CPAP : none  Fasting Blood Sugar :      / Checks Blood Sugar -- times a day:     DM0- type 2- checks glucose once daily  Hgba1c-  05/07/24- 6.1  Metformin- - none am of surgery Ozempic- last dose on  05/05/2024   Blood Thinner/ Instructions /Last Dose: ASA / Instructions/ Last Dose :

## 2024-05-06 NOTE — Patient Instructions (Signed)
 SURGICAL WAITING ROOM VISITATION  Patients having surgery or a procedure may have no more than 2 support people in the waiting area - these visitors may rotate.    Children ages 12 and under will not be able to visit patients in [facility] under most circumstances.   Visitors with respiratory illnesses are discouraged from visiting and should remain at home.  If the patient needs to stay at the hospital during part of their recovery, the visitor guidelines for inpatient rooms apply. Pre-op nurse will coordinate an appropriate time for 1 support person to accompany patient in pre-op.  This support person may not rotate.    Please refer to the [facility] website for the visitor guidelines for Inpatients (after your surgery is over and you are in a regular room).       Your procedure is scheduled on: 05/16/2024    Report to [facility] Main Entrance    Report to admitting at AM   0515  Call this number if you have problems the morning of surgery [phone]        Clear liquid diet the day before surgery.          Magnesium Citrate 8 ounces at 12 noon day before surgery.          Fleets enema nite before surgery                             If you have questions, please contact your surgeons office.   FOLLOW BOWEL PREP AND ANY ADDITIONAL PRE OP INSTRUCTIONS YOU RECEIVED FROM YOUR SURGEON'S OFFICE!!!     Oral Hygiene is also important to reduce your risk of infection.                                    Remember - BRUSH YOUR TEETH THE MORNING OF SURGERY WITH YOUR REGULAR TOOTHPASTE  DENTURES WILL BE REMOVED PRIOR TO SURGERY PLEASE DO NOT APPLY Poly grip OR ADHESIVES!!!   Do NOT smoke after Midnight   Stop all vitamins and herbal supplements 7 days before surgery.   Take these medicines the morning of surgery with A SIP OF WATER:  none              Metformin- none am of surgery.           Ozempic- last dose on   DO NOT TAKE ANY ORAL DIABETIC  MEDICATIONS DAY OF YOUR SURGERY  Bring CPAP mask and tubing day of surgery.                              You may not have any metal on your body including hair pins, jewelry, and body piercing             Do not wear make-up, lotions, powders, perfumes/cologne, or deodorant  Do not wear nail polish including gel and S&S, artificial/acrylic nails, or any other type of covering on natural nails including finger and toenails. If you have artificial nails, gel coating, etc. that needs to be removed by a nail salon please have this removed prior to surgery or surgery may need to be canceled/ delayed if the surgeon/ anesthesia feels like they are unable to be safely monitored.   Do not shave  48 hours prior to surgery.  Men may shave face and neck.   Do not bring valuables to the hospital. [facility] IS NOT             RESPONSIBLE   FOR VALUABLES.   Contacts, glasses, dentures or bridgework may not be worn into surgery.   Bring small overnight bag day of surgery.   DO NOT BRING YOUR HOME MEDICATIONS TO THE HOSPITAL. PHARMACY WILL DISPENSE MEDICATIONS LISTED ON YOUR MEDICATION LIST TO YOU DURING YOUR ADMISSION IN THE HOSPITAL!    Patients discharged on the day of surgery will not be allowed to drive home.  Someone NEEDS to stay with you for the first 24 hours after anesthesia.   Special Instructions: Bring a copy of your healthcare power of attorney and living will documents the day of surgery if you haven't scanned them before.              Please read over the following fact sheets you were given: IF YOU HAVE QUESTIONS ABOUT YOUR PRE-OP INSTRUCTIONS PLEASE CALL [phone].   If you received a COVID test during your pre-op visit  it is requested that you wear a mask when out in public, stay away from anyone that may not be feeling well and notify your surgeon if you develop symptoms. If you test positive for Covid or have been in contact with anyone that has tested positive in  the last 10 days please notify you surgeon.    [place] - Preparing for Surgery Before surgery, you can play an important role.  Because skin is not sterile, your skin needs to be as free of germs as possible.  You can reduce the number of germs on your skin by washing with CHG (chlorahexidine gluconate) soap before surgery.  CHG is an antiseptic cleaner which kills germs and bonds with the skin to continue killing germs even after washing. Please DO NOT use if you have an allergy to CHG or antibacterial soaps.  If your skin becomes reddened/irritated stop using the CHG and inform your nurse when you arrive at Short Stay. Do not shave (including legs and underarms) for at least 48 hours prior to the first CHG shower.  You may shave your face/neck.  Please follow these instructions carefully:  1.  Shower with CHG Soap the night before surgery ONLY (DO NOT USE THE SOAP THE MORNING OF SURGERY).  2.  If you choose to wash your hair, wash your hair first as usual with your normal  shampoo.  3.  After you shampoo, rinse your hair and body thoroughly to remove the shampoo.                             4.  Use CHG as you would any other liquid soap.  You can apply chg directly to the skin and wash.  Gently with a scrungie or clean washcloth.  5.  Apply the CHG Soap to your body ONLY FROM THE NECK DOWN.   Do   not use on face/ open                           Wound or open sores. Avoid contact with eyes, ears mouth and   genitals (private parts).                       Wash face,  Genitals (private parts) with your  normal soap.             6.  Wash thoroughly, paying special attention to the area where your    surgery  will be performed.  7.  Thoroughly rinse your body with warm water from the neck down.  8.  DO NOT shower/wash with your normal soap after using and rinsing off the CHG Soap.                9.  Pat yourself dry with a clean towel.            10.  Wear clean pajamas.            11.  Place  clean sheets on your bed the night of your first shower and do not  sleep with pets. Day of Surgery : Do not apply any CHG, lotions/deodorants the morning of surgery.  Please wear clean clothes to the hospital/surgery center.  FAILURE TO FOLLOW THESE INSTRUCTIONS MAY RESULT IN THE CANCELLATION OF YOUR SURGERY  PATIENT SIGNATURE_________________________________  NURSE SIGNATURE__________________________________  ________________________________________________________________________

## 2024-05-07 ENCOUNTER — Encounter (HOSPITAL_COMMUNITY)
Admission: RE | Admit: 2024-05-07 | Discharge: 2024-05-07 | Disposition: A | Discharge status: Home / Self Care | Source: Ambulatory Visit | Attending: Urology | Admitting: Urology

## 2024-05-07 ENCOUNTER — Other Ambulatory Visit: Payer: Self-pay

## 2024-05-07 ENCOUNTER — Encounter (HOSPITAL_COMMUNITY): Payer: Self-pay

## 2024-05-07 VITALS — BP 137/81 | HR 65 | Temp 98.4°F | Resp 16 | Ht 68.0 in | Wt 202.0 lb

## 2024-05-07 DIAGNOSIS — Z01818 Encounter for other preprocedural examination: Secondary | ICD-10-CM | POA: Diagnosis not present

## 2024-05-07 DIAGNOSIS — E119 Type 2 diabetes mellitus without complications: Secondary | ICD-10-CM | POA: Diagnosis present

## 2024-05-07 DIAGNOSIS — Z01810 Encounter for preprocedural cardiovascular examination: Secondary | ICD-10-CM | POA: Diagnosis present

## 2024-05-07 DIAGNOSIS — Z01812 Encounter for preprocedural laboratory examination: Secondary | ICD-10-CM | POA: Diagnosis present

## 2024-05-07 HISTORY — DX: Malignant (primary) neoplasm, unspecified: C80.1

## 2024-05-07 LAB — BASIC METABOLIC PANEL WITH GFR
Anion gap: 11 (ref 5–15)
BUN: 16 mg/dL (ref 8–23)
CO2: 23 mmol/L (ref 22–32)
Calcium: 9.4 mg/dL (ref 8.9–10.3)
Chloride: 106 mmol/L (ref 98–111)
Creatinine, Ser: 1.06 mg/dL (ref 0.61–1.24)
GFR, Estimated: >60 mL/min
Glucose, Bld: 88 mg/dL (ref 70–99)
Potassium: 3.8 mmol/L (ref 3.5–5.1)
Sodium: 140 mmol/L (ref 135–145)

## 2024-05-07 LAB — CBC
HCT: 40.3 % (ref 39.0–52.0)
Hemoglobin: 13 g/dL (ref 13.0–17.0)
MCH: 27.3 pg (ref 26.0–34.0)
MCHC: 32.3 g/dL (ref 30.0–36.0)
MCV: 84.5 fL (ref 80.0–100.0)
Platelets: 322 K/uL (ref 150–400)
RBC: 4.77 MIL/uL (ref 4.22–5.81)
RDW: 15.3 % (ref 11.5–15.5)
WBC: 7.7 K/uL (ref 4.0–10.5)
nRBC: 0 % (ref 0.0–0.2)

## 2024-05-07 LAB — HEMOGLOBIN A1C
Hgb A1c MFr Bld: 6.1 % — ABNORMAL HIGH (ref 4.8–5.6)
Mean Plasma Glucose: 128.37 mg/dL

## 2024-05-07 LAB — GLUCOSE, CAPILLARY: Glucose-Capillary: 81 mg/dL (ref 70–99)

## 2024-05-15 NOTE — Anesthesia Preprocedure Evaluation (Addendum)
"                                    Anesthesia Evaluation  Patient identified by MRN, date of birth, ID band Patient awake    Reviewed: Allergy & Precautions, NPO status , Patient's Chart, lab work & pertinent test results  Airway Mallampati: II  TM Distance: >3 FB Neck ROM: Full    Dental  (+) Dental Advisory Given, Missing,    Pulmonary neg pulmonary ROS   Pulmonary exam normal breath sounds clear to auscultation       Cardiovascular hypertension, Pt. on medications Normal cardiovascular exam Rhythm:Regular Rate:Normal     Neuro/Psych negative neurological ROS  negative psych ROS   GI/Hepatic negative GI ROS, Neg liver ROS,,,  Endo/Other  diabetes, Type 2, Oral Hypoglycemic Agents    Renal/GU negative Renal ROS  negative genitourinary   Musculoskeletal negative musculoskeletal ROS (+)    Abdominal   Peds  Hematology negative hematology ROS (+)   Anesthesia Other Findings   Reproductive/Obstetrics                              Anesthesia Physical Anesthesia Plan  ASA: 2  Anesthesia Plan: General   Post-op Pain Management: Tylenol  PO (pre-op)* and Ketamine  IV*   Induction: Intravenous  PONV Risk Score and Plan: 2 and Midazolam , Dexamethasone  and Ondansetron   Airway Management Planned: Oral ETT  Additional Equipment:   Intra-op Plan:   Post-operative Plan: Extubation in OR  Informed Consent: I have reviewed the patients History and Physical, chart, labs and discussed the procedure including the risks, benefits and alternatives for the proposed anesthesia with the patient or authorized representative who has indicated his/her understanding and acceptance.     Dental advisory given  Plan Discussed with: CRNA  Anesthesia Plan Comments: (2 IVs)         Anesthesia Quick Evaluation  "

## 2024-05-16 ENCOUNTER — Encounter (HOSPITAL_COMMUNITY): Admission: RE | Payer: Self-pay | Source: Home / Self Care

## 2024-05-16 ENCOUNTER — Observation Stay (HOSPITAL_COMMUNITY)
Admission: RE | Admit: 2024-05-16 | Discharge: 2024-05-17 | Disposition: A | Discharge status: Home / Self Care | Attending: Urology | Admitting: Urology

## 2024-05-16 ENCOUNTER — Other Ambulatory Visit: Payer: Self-pay

## 2024-05-16 ENCOUNTER — Ambulatory Visit (HOSPITAL_COMMUNITY): Admitting: Registered Nurse

## 2024-05-16 ENCOUNTER — Encounter (HOSPITAL_COMMUNITY): Payer: Self-pay | Admitting: Urology

## 2024-05-16 DIAGNOSIS — I10 Essential (primary) hypertension: Secondary | ICD-10-CM | POA: Insufficient documentation

## 2024-05-16 DIAGNOSIS — C61 Malignant neoplasm of prostate: Secondary | ICD-10-CM | POA: Diagnosis present

## 2024-05-16 DIAGNOSIS — E119 Type 2 diabetes mellitus without complications: Secondary | ICD-10-CM | POA: Diagnosis not present

## 2024-05-16 DIAGNOSIS — Z01818 Encounter for other preprocedural examination: Secondary | ICD-10-CM

## 2024-05-16 DIAGNOSIS — Z79899 Other long term (current) drug therapy: Secondary | ICD-10-CM | POA: Diagnosis not present

## 2024-05-16 DIAGNOSIS — Z7984 Long term (current) use of oral hypoglycemic drugs: Secondary | ICD-10-CM | POA: Insufficient documentation

## 2024-05-16 HISTORY — PX: ROBOT ASSISTED LAPAROSCOPIC RADICAL PROSTATECTOMY: SHX5141

## 2024-05-16 LAB — GLUCOSE, CAPILLARY
Glucose-Capillary: 122 mg/dL — ABNORMAL HIGH (ref 70–99)
Glucose-Capillary: 152 mg/dL — ABNORMAL HIGH (ref 70–99)
Glucose-Capillary: 163 mg/dL — ABNORMAL HIGH (ref 70–99)
Glucose-Capillary: 90 mg/dL (ref 70–99)
Glucose-Capillary: 96 mg/dL (ref 70–99)

## 2024-05-16 LAB — TYPE AND SCREEN
ABO/RH(D): O POS
Antibody Screen: NEGATIVE

## 2024-05-16 LAB — HEMOGLOBIN AND HEMATOCRIT, BLOOD
HCT: 39.2 % (ref 39.0–52.0)
Hemoglobin: 12.8 g/dL — ABNORMAL LOW (ref 13.0–17.0)

## 2024-05-16 LAB — ABO/RH: ABO/RH(D): O POS

## 2024-05-16 MED ORDER — FENTANYL CITRATE (PF) 100 MCG/2ML IJ SOLN
INTRAMUSCULAR | Status: DC | PRN
  Administered 2024-05-16: 25 ug via INTRAVENOUS
  Administered 2024-05-16 (×2): 50 ug via INTRAVENOUS
  Administered 2024-05-16: 25 ug via INTRAVENOUS
  Administered 2024-05-16: 50 ug via INTRAVENOUS

## 2024-05-16 MED ORDER — DIPHENHYDRAMINE HCL 12.5 MG/5ML PO ELIX: 12.5000 mg | ORAL_SOLUTION | Freq: Four times a day (QID) | ORAL | Status: DC | PRN

## 2024-05-16 MED ORDER — MORPHINE SULFATE (PF) 2 MG/ML IV SOLN: 2.0000 mg | INTRAVENOUS | Status: DC | PRN

## 2024-05-16 MED ORDER — KETAMINE HCL 50 MG/5ML IJ SOSY
PREFILLED_SYRINGE | INTRAMUSCULAR | Status: AC
  Filled 2024-05-16: qty 5

## 2024-05-16 MED ORDER — SULFAMETHOXAZOLE-TRIMETHOPRIM 800-160 MG PO TABS: 1.0000 | ORAL_TABLET | Freq: Two times a day (BID) | ORAL | 0 refills | Status: AC

## 2024-05-16 MED ORDER — MAGNESIUM CITRATE PO SOLN: 1.0000 | Freq: Once | ORAL | Status: DC

## 2024-05-16 MED ORDER — CEFAZOLIN SODIUM-DEXTROSE 2-4 GM/100ML-% IV SOLN
2.0000 g | Freq: Once | INTRAVENOUS | Status: AC
  Administered 2024-05-16: 2 g via INTRAVENOUS
  Filled 2024-05-16: qty 100

## 2024-05-16 MED ORDER — ACETAMINOPHEN 325 MG PO TABS: 650.0000 mg | ORAL_TABLET | ORAL | Status: DC | PRN

## 2024-05-16 MED ORDER — TRIPLE ANTIBIOTIC 3.5-400-5000 EX OINT: 1.0000 | TOPICAL_OINTMENT | Freq: Three times a day (TID) | CUTANEOUS | Status: DC | PRN

## 2024-05-16 MED ORDER — ROCURONIUM BROMIDE 10 MG/ML (PF) SYRINGE
PREFILLED_SYRINGE | INTRAVENOUS | Status: DC | PRN
  Administered 2024-05-16: 20 mg via INTRAVENOUS
  Administered 2024-05-16: 90 mg via INTRAVENOUS

## 2024-05-16 MED ORDER — OXYCODONE HCL 5 MG/5ML PO SOLN: 5.0000 mg | Freq: Once | ORAL | Status: AC | PRN

## 2024-05-16 MED ORDER — CEFAZOLIN SODIUM-DEXTROSE 1-4 GM/50ML-% IV SOLN
1.0000 g | Freq: Three times a day (TID) | INTRAVENOUS | Status: AC
  Administered 2024-05-16 – 2024-05-17 (×2): 1 g via INTRAVENOUS
  Filled 2024-05-16 (×2): qty 50

## 2024-05-16 MED ORDER — HEPARIN SODIUM (PORCINE) 1000 UNIT/ML IJ SOLN
INTRAMUSCULAR | Status: AC
  Filled 2024-05-16: qty 1

## 2024-05-16 MED ORDER — ZOLPIDEM TARTRATE 5 MG PO TABS: 5.0000 mg | ORAL_TABLET | Freq: Every evening | ORAL | Status: DC | PRN

## 2024-05-16 MED ORDER — LACTATED RINGERS IV SOLN: INTRAVENOUS | Status: DC

## 2024-05-16 MED ORDER — LIDOCAINE HCL (PF) 2 % IJ SOLN
INTRAMUSCULAR | Status: DC | PRN
  Administered 2024-05-16: 80 mg via INTRADERMAL

## 2024-05-16 MED ORDER — KETOROLAC TROMETHAMINE 15 MG/ML IJ SOLN
15.0000 mg | Freq: Four times a day (QID) | INTRAMUSCULAR | Status: DC
  Administered 2024-05-16 – 2024-05-17 (×4): 15 mg via INTRAVENOUS
  Filled 2024-05-16 (×4): qty 1

## 2024-05-16 MED ORDER — CHLORHEXIDINE GLUCONATE 0.12 % MT SOLN
15.0000 mL | Freq: Once | OROMUCOSAL | Status: DC
  Administered 2024-05-16: 15 mL via OROMUCOSAL

## 2024-05-16 MED ORDER — OXYCODONE HCL 5 MG PO TABS
ORAL_TABLET | ORAL | Status: AC
  Filled 2024-05-16: qty 1

## 2024-05-16 MED ORDER — SODIUM CHLORIDE 0.9 % IV BOLUS
1000.0000 mL | Freq: Once | INTRAVENOUS | Status: AC
  Administered 2024-05-16: 1000 mL via INTRAVENOUS

## 2024-05-16 MED ORDER — PHENYLEPHRINE 80 MCG/ML (10ML) SYRINGE FOR IV PUSH (FOR BLOOD PRESSURE SUPPORT)
PREFILLED_SYRINGE | INTRAVENOUS | Status: DC | PRN
  Administered 2024-05-16: 80 ug via INTRAVENOUS

## 2024-05-16 MED ORDER — FENTANYL CITRATE (PF) 100 MCG/2ML IJ SOLN
INTRAMUSCULAR | Status: AC
  Filled 2024-05-16: qty 2

## 2024-05-16 MED ORDER — BUPIVACAINE-EPINEPHRINE (PF) 0.25% -1:200000 IJ SOLN
INTRAMUSCULAR | Status: AC
  Filled 2024-05-16: qty 30

## 2024-05-16 MED ORDER — SODIUM CHLORIDE (PF) 0.9 % IJ SOLN
INTRAMUSCULAR | Status: AC
  Filled 2024-05-16: qty 20

## 2024-05-16 MED ORDER — OXYCODONE HCL 5 MG PO TABS
5.0000 mg | ORAL_TABLET | Freq: Once | ORAL | Status: AC | PRN
  Administered 2024-05-16: 5 mg via ORAL

## 2024-05-16 MED ORDER — ROCURONIUM BROMIDE 10 MG/ML (PF) SYRINGE
PREFILLED_SYRINGE | INTRAVENOUS | Status: AC
  Filled 2024-05-16: qty 10

## 2024-05-16 MED ORDER — DEXAMETHASONE SOD PHOSPHATE PF 10 MG/ML IJ SOLN
INTRAMUSCULAR | Status: DC | PRN
  Administered 2024-05-16: 5 mg via INTRAVENOUS

## 2024-05-16 MED ORDER — BUPIVACAINE-EPINEPHRINE (PF) 0.25% -1:200000 IJ SOLN
INTRAMUSCULAR | Status: DC | PRN
  Administered 2024-05-16: 30 mL

## 2024-05-16 MED ORDER — ONDANSETRON HCL 4 MG/2ML IJ SOLN
INTRAMUSCULAR | Status: DC | PRN
  Administered 2024-05-16: 4 mg via INTRAVENOUS

## 2024-05-16 MED ORDER — INSULIN ASPART 100 UNIT/ML IJ SOLN: 0.0000 [IU] | INTRAMUSCULAR | Status: DC | PRN

## 2024-05-16 MED ORDER — AMISULPRIDE (ANTIEMETIC) 5 MG/2ML IV SOLN: 10.0000 mg | Freq: Once | INTRAVENOUS | Status: DC | PRN

## 2024-05-16 MED ORDER — HYOSCYAMINE SULFATE 0.125 MG SL SUBL: 0.1250 mg | SUBLINGUAL_TABLET | Freq: Four times a day (QID) | SUBLINGUAL | Status: DC | PRN

## 2024-05-16 MED ORDER — POTASSIUM CHLORIDE IN NACL 20-0.45 MEQ/L-% IV SOLN
INTRAVENOUS | Status: DC
  Filled 2024-05-16 (×3): qty 1000

## 2024-05-16 MED ORDER — RAMIPRIL 1.25 MG PO CAPS
2.5000 mg | ORAL_CAPSULE | Freq: Every day | ORAL | Status: DC
  Administered 2024-05-16 – 2024-05-17 (×2): 2.5 mg via ORAL
  Filled 2024-05-16 (×2): qty 2

## 2024-05-16 MED ORDER — FENTANYL CITRATE (PF) 50 MCG/ML IJ SOSY
PREFILLED_SYRINGE | INTRAMUSCULAR | Status: AC
  Filled 2024-05-16: qty 1

## 2024-05-16 MED ORDER — DOCUSATE SODIUM 100 MG PO CAPS: 100.0000 mg | ORAL_CAPSULE | Freq: Two times a day (BID) | ORAL | Status: AC

## 2024-05-16 MED ORDER — ONDANSETRON HCL 4 MG/2ML IJ SOLN: 4.0000 mg | INTRAMUSCULAR | Status: DC | PRN

## 2024-05-16 MED ORDER — DIPHENHYDRAMINE HCL 50 MG/ML IJ SOLN: 12.5000 mg | Freq: Four times a day (QID) | INTRAMUSCULAR | Status: DC | PRN

## 2024-05-16 MED ORDER — ONDANSETRON HCL 4 MG/2ML IJ SOLN
INTRAMUSCULAR | Status: AC
  Filled 2024-05-16: qty 2

## 2024-05-16 MED ORDER — DOCUSATE SODIUM 100 MG PO CAPS
100.0000 mg | ORAL_CAPSULE | Freq: Two times a day (BID) | ORAL | Status: DC
  Administered 2024-05-16 – 2024-05-17 (×2): 100 mg via ORAL
  Filled 2024-05-16 (×2): qty 1

## 2024-05-16 MED ORDER — FLEET ENEMA RE ENEM: 1.0000 | ENEMA | Freq: Once | RECTAL | Status: DC

## 2024-05-16 MED ORDER — PROPOFOL 10 MG/ML IV BOLUS
INTRAVENOUS | Status: DC | PRN
  Administered 2024-05-16: 200 mg via INTRAVENOUS

## 2024-05-16 MED ORDER — STERILE WATER FOR IRRIGATION IR SOLN
Status: DC | PRN
  Administered 2024-05-16: 1000 mL

## 2024-05-16 MED ORDER — LIDOCAINE HCL 2 % IJ SOLN
INTRAMUSCULAR | Status: AC
  Filled 2024-05-16: qty 20

## 2024-05-16 MED ORDER — SUGAMMADEX SODIUM 200 MG/2ML IV SOLN
INTRAVENOUS | Status: DC | PRN
  Administered 2024-05-16: 200 mg via INTRAVENOUS

## 2024-05-16 MED ORDER — ATORVASTATIN CALCIUM 10 MG PO TABS
10.0000 mg | ORAL_TABLET | Freq: Every day | ORAL | Status: DC
  Administered 2024-05-16 – 2024-05-17 (×2): 10 mg via ORAL
  Filled 2024-05-16 (×2): qty 1

## 2024-05-16 MED ORDER — CHLORHEXIDINE GLUCONATE CLOTH 2 % EX PADS
6.0000 | MEDICATED_PAD | Freq: Every day | CUTANEOUS | Status: DC
  Administered 2024-05-16 – 2024-05-17 (×2): 6 via TOPICAL

## 2024-05-16 MED ORDER — FENTANYL CITRATE (PF) 50 MCG/ML IJ SOSY
25.0000 ug | PREFILLED_SYRINGE | INTRAMUSCULAR | Status: DC | PRN
  Administered 2024-05-16 (×3): 25 ug via INTRAVENOUS

## 2024-05-16 MED ORDER — PHENYLEPHRINE HCL-NACL 20-0.9 MG/250ML-% IV SOLN
INTRAVENOUS | Status: DC | PRN
  Administered 2024-05-16: 20 ug/min via INTRAVENOUS

## 2024-05-16 MED ORDER — MIDAZOLAM HCL 5 MG/5ML IJ SOLN
INTRAMUSCULAR | Status: DC | PRN
  Administered 2024-05-16: 2 mg via INTRAVENOUS

## 2024-05-16 MED ORDER — TRAMADOL HCL 50 MG PO TABS: 50.0000 mg | ORAL_TABLET | Freq: Four times a day (QID) | ORAL | 0 refills | Status: AC | PRN

## 2024-05-16 MED ORDER — INSULIN ASPART 100 UNIT/ML IJ SOLN
0.0000 [IU] | INTRAMUSCULAR | Status: DC
  Administered 2024-05-16 (×2): 3 [IU] via SUBCUTANEOUS
  Filled 2024-05-16 (×2): qty 3

## 2024-05-16 MED ORDER — DEXAMETHASONE SOD PHOSPHATE PF 10 MG/ML IJ SOLN
INTRAMUSCULAR | Status: AC
  Filled 2024-05-16: qty 1

## 2024-05-16 MED ORDER — LACTATED RINGERS IV SOLN
INTRAVENOUS | Status: DC | PRN
  Administered 2024-05-16: 1000 mL

## 2024-05-16 MED ORDER — PHENYLEPHRINE 80 MCG/ML (10ML) SYRINGE FOR IV PUSH (FOR BLOOD PRESSURE SUPPORT)
PREFILLED_SYRINGE | INTRAVENOUS | Status: AC
  Filled 2024-05-16: qty 10

## 2024-05-16 MED ORDER — MIDAZOLAM HCL 2 MG/2ML IJ SOLN
INTRAMUSCULAR | Status: AC
  Filled 2024-05-16: qty 2

## 2024-05-16 MED ORDER — KETAMINE HCL 50 MG/5ML IJ SOSY
PREFILLED_SYRINGE | INTRAMUSCULAR | Status: DC | PRN
  Administered 2024-05-16: 20 mg via INTRAVENOUS

## 2024-05-16 MED ORDER — PROPOFOL 10 MG/ML IV BOLUS
INTRAVENOUS | Status: AC
  Filled 2024-05-16: qty 20

## 2024-05-16 MED ORDER — SUGAMMADEX SODIUM 200 MG/2ML IV SOLN
INTRAVENOUS | Status: AC
  Filled 2024-05-16: qty 2

## 2024-05-16 MED ORDER — ACETAMINOPHEN 500 MG PO TABS
1000.0000 mg | ORAL_TABLET | Freq: Once | ORAL | Status: AC
  Administered 2024-05-16: 1000 mg via ORAL
  Filled 2024-05-16: qty 2

## 2024-05-16 MED ORDER — ORAL CARE MOUTH RINSE: 15.0000 mL | Freq: Once | OROMUCOSAL | Status: DC

## 2024-05-16 NOTE — Op Note (Signed)
 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 M.D.  Assistant: Alan Hammonds, PA-C  An assistant was required for this surgical procedure.  The duties of the assistant included but were not limited to suctioning, passing suture, camera manipulation, retraction. This procedure would not be able to be performed without an geophysicist/field seismologist.  Anesthesia: General  Complications: None  EBL: 100 mL  IVF:  800 mL crystalloid  Specimens: Prostate and seminal vesicles Right pelvic lymph nodes Left pelvic lymph nodes  Disposition of specimens: Pathology  Drains: 20 Fr coude catheter # 19 Blake pelvic drain  Indication: Chris Espinoza is a 64 y.o. year old patient with clinically localized prostate cancer.  After a thorough review of the management options for treatment of prostate cancer, he elected to proceed with surgical therapy and the above procedure(s).  We have discussed the potential benefits and risks of the procedure, side effects of the proposed treatment, the likelihood of the patient achieving the goals of the procedure, and any potential problems that might occur during the procedure or recuperation. Informed consent has been obtained.  Description of procedure:  The patient was taken to the operating room and a general anesthetic was administered. He was given preoperative antibiotics, placed in the dorsal lithotomy position, and prepped and draped in the usual sterile fashion. Next a preoperative timeout was performed. A urethral catheter was placed into the bladder and a site was selected near the umbilicus for placement of the camera  port. This was placed using a standard open Hassan technique which allowed entry into the peritoneal cavity under direct vision and without difficulty. An 8 mm robotic port was placed and a pneumoperitoneum established. The camera was then used to inspect the abdomen and there was no evidence of any intra-abdominal injuries or other abnormalities. The remaining abdominal ports were then placed. 8 mm robotic ports were placed in the right lower quadrant, left lower quadrant, and far left lateral abdominal wall. A 5 mm port was placed in the right upper quadrant and a 12 mm port was placed in the right lateral abdominal wall for laparoscopic assistance. All ports were placed under direct vision without difficulty. The surgical cart was then docked.   Utilizing the cautery scissors, the bladder was reflected posteriorly allowing entry into the space of Retzius and identification of the endopelvic fascia and prostate. The periprostatic fat was then removed from the prostate allowing full exposure of the endopelvic fascia. The endopelvic fascia was then incised from the apex back to the base of the prostate bilaterally and the underlying levator muscle fibers were swept laterally off the prostate thereby isolating the dorsal venous complex. The dorsal vein was then stapled and divided with a 45 mm Flex Echelon stapler. Attention then turned to the bladder neck which was divided anteriorly thereby allowing entry into the bladder and exposure of the urethral catheter. The catheter balloon was deflated and the catheter was brought into the operative field and used to retract the prostate anteriorly. The posterior bladder neck was then examined and was divided allowing further dissection between the bladder and prostate posteriorly until the vasa deferentia and seminal vessels were identified. The vasa deferentia were isolated, divided, and lifted anteriorly. The seminal vesicles were dissected down to their  tips with care  to control the seminal vascular arterial blood supply. These structures were then lifted anteriorly and the space between Denonvilliers fascia and the anterior rectum was developed with a combination of sharp and blunt dissection. This isolated the vascular pedicles of the prostate.  The lateral prostatic fascia was then sharply incised allowing release of the neurovascular bundles bilaterally. The vascular pedicles of the prostate were then ligated with Weck clips between the prostate and neurovascular bundles and divided with sharp cold scissor dissection resulting in neurovascular bundle preservation. The neurovascular bundles were then separated off the apex of the prostate and urethra bilaterally.  The urethra was then sharply transected allowing the prostate specimen to be disarticulated. The pelvis was copiously irrigated and hemostasis was ensured. There was no evidence for rectal injury.  Attention then turned to the right pelvic sidewall. The fibrofatty tissue between the external iliac vein, confluence of the iliac vessels, hypogastric artery, and Cooper's ligament was dissected free from the pelvic sidewall with care to preserve the obturator nerve. Weck clips were used for lymphostasis and hemostasis. An identical procedure was performed on the contralateral side and the lymphatic packets were removed for permanent pathologic analysis.  Attention then turned to the urethral anastomosis. A 2-0 Vicryl slip knot was placed between Denonvilliers fascia, the posterior bladder neck, and the posterior urethra to reapproximate these structures. A double-armed 3-0 Monocryl suture was then used to perform a 360 running tension-free anastomosis between the bladder neck and urethra. A new urethral catheter was then placed into the bladder and irrigated. There were no blood clots within the bladder and the anastomosis appeared to be watertight. A #19 Blake drain was then brought through the left lateral 8  mm port site and positioned appropriately within the pelvis. It was secured to the skin with a nylon suture. The surgical cart was then undocked. The right lateral 12 mm port site was closed at the fascial level with a 0 Vicryl suture placed laparoscopically. All remaining ports were then removed under direct vision. The prostate specimen was removed intact within the Endopouch retrieval bag via the periumbilical camera port site. This fascial opening was closed with two running 0 PDS sutures. 0.25% Marcaine  was then injected into all port sites and all incisions were reapproximated at the skin level with 4-0 Monocryl subcuticular sutures and Dermabond. The patient appeared to tolerate the procedure well and without complications. The patient was able to be extubated and transferred to the recovery unit in satisfactory condition.   Gretel CANDIE Renda Teddie MD

## 2024-05-16 NOTE — Progress Notes (Signed)
 Patient ID: Chris Espinoza, male   DOB: 1960-07-23, 64 y.o.   MRN: [id number]  Post-op note  Subjective: The patient is doing well.  No complaints.  Objective: Vital signs in last 24 hours: Temp:  [97.6 F (36.4 C)-99.1 F (37.3 C)] 97.6 F (36.4 C) (01/15 1300) Pulse Rate:  [63-78] 78 (01/15 1445) Resp:  [9-23] 22 (01/15 1445) BP: (111-153)/(68-104) 138/84 (01/15 1400) SpO2:  [91 %-100 %] 94 % (01/15 1445) Weight:  [91.1 kg] 91.1 kg (01/15 0548)  Intake/Output from previous day: No intake/output data recorded. Intake/Output this shift: Total I/O In: 1382.3 [P.O.:247; I.V.:1035.3; IV Piggyback:100] Out: 190 [Drains:90; Blood:100]  Physical Exam:  General: Alert and oriented. Abdomen: Soft, Nondistended. Incisions: Clean and dry. GU: Urine clearing.  Lab Results: Recent Labs    05/16/24 1046  HGB 12.8*  HCT 39.2    Assessment/Plan: POD#0   1) Continue to monitor, ambulate, IS   Chris Espinoza. MD   LOS: 0 days   Chris Espinoza 05/16/2024, 3:22 PM

## 2024-05-16 NOTE — Discharge Instructions (Signed)
 Activity:  You are encouraged to ambulate frequently (about every hour during waking hours) to help prevent blood clots from forming in your legs or lungs.  However, you should not engage in any heavy lifting (> 10-15 lbs), strenuous activity, or straining. Diet: You should continue a clear liquid diet until passing gas from below.  Once this occurs, you may advance your diet to a soft diet that would be easy to digest (i.e soups, scrambled eggs, mashed potatoes, etc.) for 24 hours just as you would if getting over a bad stomach flu.  If tolerating this diet well for 24 hours, you may then begin eating regular food.  It will be normal to have some amount of bloating, nausea, and abdominal discomfort intermittently. Prescriptions:  You will be provided a prescription for pain medication to take as needed.  If your pain is not severe enough to require the prescription pain medication, you may take Tylenol  instead.  You should also take an over the counter stool softener (Colace 100 mg twice daily) to avoid straining with bowel movements as the pain medication may constipate you. Finally, you will also be provided a prescription for an antibiotic to begin the day prior to your return visit in the office for catheter removal. Catheter care: You will be taught how to take care of the catheter by the nursing staff prior to discharge from the hospital.  You may use both a leg bag and the larger bedside bag but it is recommended to at least use the bigger bedside bag at nighttime as the leg bag is small and will fill up overnight and also does not drain as well when lying flat. You may periodically feel a strong urge to void with the catheter in place.  This is a bladder spasm and most often can occur when having a bowel movement or when you are moving around. It is typically self-limited and usually will stop after a few minutes.  You may use some Vaseline or Neosporin around the tip of the catheter to reduce friction  at the tip of the penis. Incisions: You may remove your dressing bandages the 2nd day after surgery.  You most likely will have a few small staples in each of the incisions and once the bandages are removed, the incisions may stay open to air.  You may start showering (not soaking or bathing in water ) 48 hours after surgery and the incisions simply need to be patted dry after the shower.  No additional care is needed. What to call us  about: You should call the office [phone]) if you develop fever > 101, persistent vomiting, or the catheter stops draining. Also, feel free to call with any other questions you may have and remember the handout that was provided to you as a reference preoperatively which answers many of the common questions that arise after surgery. You may resume Ozempic, aspirin, advil, aleve , vitamins, and supplements 7 days after surgery.

## 2024-05-16 NOTE — Transfer of Care (Signed)
 Immediate Anesthesia Transfer of Care Note  Patient: Chris Espinoza  Procedure(s) Performed: PROSTATECTOMY, RADICAL, ROBOT-ASSISTED, LAPAROSCOPIC LYMPHADENECTOMY, PELVIS, ROBOT-ASSISTED (Bilateral)  Patient Location: PACU  Anesthesia Type:General  Level of Consciousness: awake and patient cooperative  Airway & Oxygen Therapy: Patient Spontanous Breathing and Patient connected to face mask oxygen  Post-op Assessment: Report given to RN, Post -op Vital signs reviewed and stable, and Patient moving all extremities  Post vital signs: Reviewed and stable  Last Vitals:  Vitals Value Taken Time  BP 151/88 05/16/24 09:47  Temp    Pulse 74 05/16/24 09:52  Resp 23 05/16/24 09:52  SpO2 100 % 05/16/24 09:52  Vitals shown include unfiled device data.  Last Pain:  Vitals:   05/16/24 0548  TempSrc:   PainSc: 0-No pain         Complications: No notable events documented.

## 2024-05-16 NOTE — Anesthesia Postprocedure Evaluation (Signed)
"   Anesthesia Post Note  Patient: Chris Espinoza  Procedure(s) Performed: PROSTATECTOMY, RADICAL, ROBOT-ASSISTED, LAPAROSCOPIC LYMPHADENECTOMY, PELVIS, ROBOT-ASSISTED (Bilateral)     Patient location during evaluation: PACU Anesthesia Type: General Level of consciousness: awake and alert Pain management: pain level controlled Vital Signs Assessment: post-procedure vital signs reviewed and stable Respiratory status: spontaneous breathing, nonlabored ventilation, respiratory function stable and patient connected to nasal cannula oxygen Cardiovascular status: blood pressure returned to baseline and stable Postop Assessment: no apparent nausea or vomiting Anesthetic complications: no   No notable events documented.  Last Vitals:  Vitals:   05/16/24 1110 05/16/24 1115  BP:  (!) 111/95  Pulse: 71 69  Resp: 17 14  Temp:    SpO2: 92% 95%    Last Pain:  Vitals:   05/16/24 1100  TempSrc:   PainSc: 7                  Toluwanimi Radebaugh L Alyssa Mancera      "

## 2024-05-16 NOTE — Interval H&P Note (Signed)
 History and Physical Interval Note:  05/16/2024 6:51 AM  Chris Espinoza  has presented today for surgery, with the diagnosis of PROSTATE CANCER.  The various methods of treatment have been discussed with the patient and family. After consideration of risks, benefits and other options for treatment, the patient has consented to  Procedures with comments: PROSTATECTOMY, RADICAL, ROBOT-ASSISTED, LAPAROSCOPIC (N/A) - LEVEL 2 LYMPHADENECTOMY, PELVIS, ROBOT-ASSISTED (Bilateral) as a surgical intervention.  The patient's history has been reviewed, patient examined, no change in status, stable for surgery.  I have reviewed the patient's chart and labs.  Questions were answered to the patient's satisfaction.     [employer]

## 2024-05-16 NOTE — Anesthesia Procedure Notes (Signed)
 Procedure Name: Intubation Date/Time: 05/16/2024 7:31 AM  Performed by: Woodrum, Chelsey L, MDPre-anesthesia Checklist: Patient identified, Emergency Drugs available, Suction available, Patient being monitored and Timeout performed Patient Re-evaluated:Patient Re-evaluated prior to induction Oxygen Delivery Method: Circle system utilized Preoxygenation: Pre-oxygenation with 100% oxygen Induction Type: IV induction Ventilation: Mask ventilation without difficulty Laryngoscope Size: 4 and Mac Grade View: Grade I Tube type: Oral Tube size: 7.5 mm Number of attempts: 1 Airway Equipment and Method: Stylet Placement Confirmation: ETT inserted through vocal cords under direct vision, positive ETCO2 and breath sounds checked- equal and bilateral Secured at: 22 cm Tube secured with: Tape Dental Injury: Teeth and Oropharynx as per pre-operative assessment

## 2024-05-16 NOTE — Plan of Care (Signed)
" °  Problem: Pain Management: Goal: General experience of comfort will improve Outcome: Progressing   Problem: Urinary Elimination: Goal: Ability to achieve and maintain urine output will improve Outcome: Progressing   Problem: Clinical Measurements: Goal: Ability to maintain clinical measurements within normal limits Outcome: Progressing   Problem: Clinical Measurements: Goal: Postoperative complications will be avoided or minimized Outcome: Progressing   "

## 2024-05-17 ENCOUNTER — Encounter (HOSPITAL_COMMUNITY): Payer: Self-pay | Admitting: Urology

## 2024-05-17 DIAGNOSIS — C61 Malignant neoplasm of prostate: Secondary | ICD-10-CM | POA: Diagnosis not present

## 2024-05-17 LAB — GLUCOSE, CAPILLARY
Glucose-Capillary: 87 mg/dL (ref 70–99)
Glucose-Capillary: 98 mg/dL (ref 70–99)
Glucose-Capillary: 86 mg/dL (ref 70–99)
Glucose-Capillary: 91 mg/dL (ref 70–99)

## 2024-05-17 LAB — HEMOGLOBIN AND HEMATOCRIT, BLOOD
HCT: 35.2 % — ABNORMAL LOW (ref 39.0–52.0)
Hemoglobin: 12 g/dL — ABNORMAL LOW (ref 13.0–17.0)

## 2024-05-17 MED ORDER — TRAMADOL HCL 50 MG PO TABS: 50.0000 mg | ORAL_TABLET | Freq: Four times a day (QID) | ORAL | Status: DC | PRN

## 2024-05-17 MED ORDER — BISACODYL 10 MG RE SUPP
10.0000 mg | Freq: Once | RECTAL | Status: AC
  Administered 2024-05-17: 10 mg via RECTAL
  Filled 2024-05-17: qty 1

## 2024-05-17 NOTE — Progress Notes (Signed)
" °   05/17/24 1059  TOC Brief Assessment  Insurance and Status Reviewed  Patient has primary care physician Yes  Home environment has been reviewed Resides in single family home with spouse  Prior level of function: Independent with ADLs at baseline  Prior/Current Home Services No current home services  Social Drivers of Health Review SDOH reviewed no interventions necessary  Readmission risk has been reviewed Yes  Transition of care needs no transition of care needs at this time    "

## 2024-05-17 NOTE — Plan of Care (Signed)
  Problem: Education: Goal: Knowledge of the procedure and recovery process will improve Outcome: Progressing   Problem: Bowel/Gastric: Goal: Gastrointestinal status for postoperative course will improve Outcome: Progressing   Problem: Pain Management: Goal: General experience of comfort will improve Outcome: Progressing   Problem: Skin Integrity: Goal: Demonstration of wound healing without infection will improve Outcome: Progressing

## 2024-05-17 NOTE — Care Management Obs Status (Signed)
 MEDICARE OBSERVATION STATUS NOTIFICATION   Patient Details  Name: Chris Espinoza MRN: [id number] Date of Birth: [birth year]   Medicare Observation Status Notification Given:  Yes    Duwaine GORMAN Aran, LCSW 05/17/2024, 11:23 AM

## 2024-05-17 NOTE — Progress Notes (Signed)
 Patient ID: Chris Espinoza, male   DOB: 12-19-1960, 64 y.o.   MRN: [id number]  1 Day Post-Op Subjective: The patient is doing well.  No nausea or vomiting. Pain is adequately controlled.  Objective: Vital signs in last 24 hours: Temp:  [97.6 F (36.4 C)-99.1 F (37.3 C)] 98.7 F (37.1 C) (01/16 0730) Pulse Rate:  [63-84] 64 (01/16 0730) Resp:  [9-23] 16 (01/16 0730) BP: (111-159)/(68-104) 120/77 (01/16 0730) SpO2:  [91 %-100 %] 98 % (01/16 0730)  Intake/Output from previous day: 01/15 0701 - 01/16 0700 In: 2974.9 [P.O.:247; I.V.:2527.9; IV Piggyback:200] Out: 2790 [Urine:2525; Drains:165; Blood:100] Intake/Output this shift: No intake/output data recorded.  Physical Exam:  General: Alert and oriented. CV: RRR Lungs: Clear bilaterally. GI: Soft, Nondistended. Incisions: Clean, dry, and intact Urine: Clear Extremities: Nontender, no erythema, no edema.  Lab Results: Recent Labs    05/16/24 1046 05/17/24 0420  HGB 12.8* 12.0*  HCT 39.2 35.2*      Assessment/Plan: POD# 1 s/p robotic prostatectomy.  1) SL IVF 2) Ambulate, Incentive spirometry 3) Transition to oral pain medication 4) Dulcolax suppository 5) D/C pelvic drain 6) Plan for likely discharge later today   Chris Espinoza. MD   LOS: 0 days   Chris Espinoza 05/17/2024, 7:42 AM

## 2024-05-17 NOTE — Discharge Summary (Signed)
" °  Date of admission: 05/16/2024  Date of discharge: 05/17/2024  Admission diagnosis: Prostate Cancer  Discharge diagnosis: Prostate Cancer  History and Physical: For full details, please see admission history and physical. Briefly, Chris Espinoza is a 64 y.o. gentleman with localized prostate cancer.  After discussing management/treatment options, he elected to proceed with surgical treatment.  Hospital Course: Eissa Buchberger was taken to the operating room on 05/16/2024 and underwent a robotic assisted laparoscopic radical prostatectomy. He tolerated this procedure well and without complications. Postoperatively, he was able to be transferred to a regular hospital room following recovery from anesthesia.  He was able to begin ambulating the night of surgery. He remained hemodynamically stable overnight.  He had excellent urine output with appropriately minimal output from his pelvic drain and his pelvic drain was removed on POD #1.  He was transitioned to oral pain medication, tolerated a clear liquid diet, and had met all discharge criteria and was able to be discharged home later on POD#1.  Laboratory values:  Recent Labs    05/16/24 1046 05/17/24 0420  HGB 12.8* 12.0*  HCT 39.2 35.2*    Disposition: Home  Discharge instruction: He was instructed to be ambulatory but to refrain from heavy lifting, strenuous activity, or driving. He was instructed on urethral catheter care.  Discharge medications:   Allergies as of 05/17/2024       Reactions   Penicillins         Medication List     STOP taking these medications    Ozempic (2 MG/DOSE) 8 MG/3ML Sopn Generic drug: Semaglutide (2 MG/DOSE)       TAKE these medications    atorvastatin  10 MG tablet Commonly known as: LIPITOR Take 10 mg by mouth daily.   docusate sodium  100 MG capsule Commonly known as: COLACE Take 1 capsule (100 mg total) by mouth 2 (two) times daily.   metFORMIN 1000 MG tablet Commonly known as:  GLUCOPHAGE Take 1,000 mg by mouth daily.   OneTouch Delica Plus Lancet33G Misc USE TO CHECK YOUR BLOOD SUGAR  ONCE DAILY AS DIRECETED; Duration: 100   OneTouch Verio test strip Generic drug: glucose blood 1 each daily.   ramipril  2.5 MG capsule Commonly known as: ALTACE  Take 2.5 mg by mouth daily.   sulfamethoxazole -trimethoprim  800-160 MG tablet Commonly known as: BACTRIM  DS Take 1 tablet by mouth 2 (two) times daily. Start the day prior to foley removal appointment Notes to patient: Take with food   traMADol  50 MG tablet Commonly known as: Ultram  Take 1-2 tablets (50-100 mg total) by mouth every 6 (six) hours as needed for moderate pain (pain score 4-6) or severe pain (pain score 7-10).               Discharge Care Instructions  (From admission, onward)           Start     Ordered   05/17/24 0000  Change dressing (specify)       Comments: Dressing change: as needed   05/17/24 1341            Followup: He will followup in 1 week for catheter removal and to discuss his surgical pathology results.   "

## 2024-05-17 NOTE — Progress Notes (Signed)
 Patient received discharge orders to go home with foley catheter. Patient was given discharge paperwork/instructions, as well as foley catheter supplies and dressing supplies. RN went over foley catheter and leg bag teaching, as well as discharge instructions/paperwork. All questions/concerns were addressed/answered to the best of RN's ability during this time. Patient left the hospital stable, had discharge paperwork/instructions and supplies, and had all personal belongings.

## 2024-05-20 LAB — SURGICAL PATHOLOGY

## 2024-05-24 DIAGNOSIS — C61 Malignant neoplasm of prostate: Secondary | ICD-10-CM

## 2024-05-24 NOTE — Progress Notes (Signed)
 Patient proceed with robotic prostatectomy with Dr. Renda on 05/16/24 and had post op visit on 05/22/24.   Survivorship referral placed.
